# Patient Record
Sex: Male | Born: 1960 | Race: White | Hispanic: No | Marital: Married | Smoking: Never smoker
Health system: Southern US, Community
[De-identification: ages and names within clinical notes are randomized; demographics above are authoritative.]

---

## 2017-12-07 ENCOUNTER — Encounter: Payer: Self-pay | Admitting: Sports Medicine

## 2017-12-07 ENCOUNTER — Ambulatory Visit: Payer: 59 | Admitting: Sports Medicine

## 2017-12-07 ENCOUNTER — Ambulatory Visit (INDEPENDENT_AMBULATORY_CARE_PROVIDER_SITE_OTHER): Payer: 59

## 2017-12-07 VITALS — BP 137/81 | HR 67 | Ht 72.0 in | Wt 285.0 lb

## 2017-12-07 DIAGNOSIS — M216X2 Other acquired deformities of left foot: Secondary | ICD-10-CM

## 2017-12-07 DIAGNOSIS — M722 Plantar fascial fibromatosis: Secondary | ICD-10-CM | POA: Diagnosis not present

## 2017-12-07 DIAGNOSIS — M79672 Pain in left foot: Secondary | ICD-10-CM

## 2017-12-07 DIAGNOSIS — L84 Corns and callosities: Secondary | ICD-10-CM | POA: Diagnosis not present

## 2017-12-07 MED ORDER — METHYLPREDNISOLONE 4 MG PO TBPK: ORAL_TABLET | ORAL | 0 refills | Status: DC

## 2017-12-07 MED ORDER — MELOXICAM 15 MG PO TABS: 15.0000 mg | ORAL_TABLET | Freq: Every day | ORAL | 0 refills | Status: DC

## 2017-12-07 NOTE — Progress Notes (Signed)
Subjective: Samuel Villarreal is a 57 y.o. male patient presents to office with complaint of moderate heel pain some days on the left for the last 2 weeks. Patient admits that the pain could have started after a trip to [place] with a did a lot of walking and driving.  Patient admits to pain that feels like a burning sensation when he is attempting to stretch and sometimes sharp to the bottom and sometimes the back of the heel.  Patient states that he has tried using a golf ball and stretching with no additional relief.  Patient states that he is also concerned about callus areas to the bottom of his feet he has tried over-the-counter wart pads and wants to have this checked as well. Denies any other pedal complaints.   Review of Systems  Musculoskeletal: Positive for joint pain.  All other systems reviewed and are negative.   There are no active problems to display for this patient.   No current outpatient medications on file prior to visit.   No current facility-administered medications on file prior to visit.     No Known Allergies  Objective: Physical Exam General: The patient is alert and oriented x3 in no acute distress.  Dermatology: Mild callusing to bilateral heels and plantar lateral foot, skin is warm, dry and supple bilateral lower extremities. Nails 1-10 are normal. There is no erythema, edema, no eccymosis, no open lesions present. Integument is otherwise unremarkable.  Vascular: Dorsalis Pedis pulse and Posterior Tibial pulse are 2/4 bilateral. Capillary fill time is immediate to all digits.  Neurological: Grossly intact to light touch with an achilles reflex of +2/5 and a  negative Tinel's sign bilateral.  Musculoskeletal: Minimal tenderness to palpation at the medial calcaneal tubercale and through the insertion of the plantar fascia on the left foot. No pain with compression of calcaneus bilateral. No pain with tuning fork to calcaneus bilateral. No pain with calf  compression bilateral. There is decreased Ankle joint range of motion bilateral left greater than right. All other joints range of motion within normal limits bilateral. Strength 5/5 in all groups bilateral.   Gait: Unassisted  Xray, Left foot:  Normal osseous mineralization. Joint spaces preserved. No fracture/dislocation/boney destruction.  Minimal calcaneal spur present with mild thickening of plantar fascia. No other soft tissue abnormalities or radiopaque foreign bodies.   Assessment and Plan: Problem List Items Addressed This Visit    None    Visit Diagnoses    Plantar fasciitis, left    -  Primary   Relevant Medications   methylPREDNISolone (MEDROL DOSEPAK) 4 MG TBPK tablet   meloxicam (MOBIC) 15 MG tablet   Other Relevant Orders   DG Foot Complete Left   Pain of left heel       Relevant Orders   DG Foot Complete Left   Acquired equinus deformity of left foot       Callus of heel         -Complete examination performed.  -Recommend for callus skin over-the-counter to keep healthy feet and use of pumice stone as needed -Xrays reviewed -Discussed with patient in detail the condition of plantar fasciitis with tight Achilles, how this occurs and general treatment options. Explained both conservative and surgical treatments.  -Rx Meloxicam to start after Medrol dose pack is completed -Recommended good supportive shoes and advised use of OTC insert. Explained to patient that if these orthoses work well, we will continue with these. If these do not improve his condition and  pain, we will consider custom molded orthoses. - Explained in detail the use of the left night splint which was dispensed at today's visit. -Explained and dispensed to patient daily stretching exercises. -Recommend patient to ice affected area 1-2x daily. -Patient to return to office in 4 weeks for follow up or sooner if problems or questions arise. If pain no better injection next visit.  Asencion Islam,  DPM

## 2017-12-07 NOTE — Patient Instructions (Signed)

## 2018-01-04 ENCOUNTER — Ambulatory Visit: Payer: 59 | Admitting: Sports Medicine

## 2018-01-04 ENCOUNTER — Encounter: Payer: Self-pay | Admitting: Sports Medicine

## 2018-01-04 DIAGNOSIS — M79672 Pain in left foot: Secondary | ICD-10-CM

## 2018-01-04 DIAGNOSIS — M722 Plantar fascial fibromatosis: Secondary | ICD-10-CM

## 2018-01-04 DIAGNOSIS — M216X2 Other acquired deformities of left foot: Secondary | ICD-10-CM

## 2018-01-04 MED ORDER — TRIAMCINOLONE ACETONIDE 40 MG/ML IJ SUSP: 20.0000 mg | Freq: Once | INTRAMUSCULAR | Status: AC

## 2018-01-04 NOTE — Progress Notes (Signed)
Subjective: Samuel Villarreal is a 57 y.o. male patient returns to office for follow-up of left heel pain.  Patient states that he has taken his meloxicam and steroid Dosepak and it has not helped relieve the pain completely states that the pain was eased off however he still having symptoms especially with first getting up after period of sitting and with first few steps out of bed in the morning states that the pain is sharp 5 out of 10 to the heel states that he also has been compliant with using his inserts and night splint and doing stretching which has not helped.  Patient states that he has even cut back on his exercise to see if this would also help with giving him some relief. Denies any other pedal complaints.   There are no active problems to display for this patient.   Current Outpatient Medications on File Prior to Visit  Medication Sig Dispense Refill  . meloxicam (MOBIC) 15 MG tablet Take 1 tablet (15 mg total) by mouth daily. 30 tablet 0  . methylPREDNISolone (MEDROL DOSEPAK) 4 MG TBPK tablet Take 1st as instructed 21 tablet 0   No current facility-administered medications on file prior to visit.     No Known Allergies  Objective: Physical Exam General: The patient is alert and oriented x3 in no acute distress.  Dermatology: Mild callusing to bilateral heels and plantar lateral foot, skin is warm, dry and supple bilateral lower extremities. Nails 1-10 are normal. There is no erythema, edema, no eccymosis, no open lesions present. Integument is otherwise unremarkable.  Vascular: Dorsalis Pedis pulse and Posterior Tibial pulse are 2/4 bilateral. Capillary fill time is immediate to all digits.  Neurological: Grossly intact to light touch with an achilles reflex of +2/5 and a  negative Tinel's sign bilateral.  Musculoskeletal: Mild tenderness to palpation at the medial calcaneal tubercale and through the insertion of the plantar fascia on the left foot. No pain with compression  of calcaneus bilateral. No pain with tuning fork to calcaneus bilateral. No pain with calf compression bilateral. There is decreased Ankle joint range of motion bilateral left greater than right. All other joints range of motion within normal limits bilateral. Strength 5/5 in all groups bilateral.   Assessment and Plan: Problem List Items Addressed This Visit    None    Visit Diagnoses    Plantar fasciitis, left    -  Primary   Relevant Medications   triamcinolone acetonide (KENALOG-40) injection 20 mg (Start on 01/04/2018  5:00 PM)   Pain of left heel       Acquired equinus deformity of left foot         -Complete examination performed.  -Re-Discussed with patient in detail the condition of plantar fasciitis with tight Achilles, how this occurs and general treatment options. Explained both conservative and surgical treatments.  After oral consent and aseptic prep, injected a mixture containing 1 ml of 2% plain lidocaine, 1 ml 0.5% plain marcaine, 0.5 ml of kenalog 40 and 0.5 ml of dexamethasone phosphate into left heel without complication. Post-injection care discussed with patient.  -Dispensed heel lift to use bilateral -Recommend continue with good supportive shoes and advised use of OTC insert with the heel lift. -Continue with night splint daily -Continue with daily stretching exercises -Continue to ice affected area 1-2x daily. -Patient to return to office in 4 weeks for follow up or sooner if problems or questions arise.   Samuel Villarreal, DPM

## 2018-02-02 ENCOUNTER — Ambulatory Visit: Payer: 59 | Admitting: Sports Medicine

## 2018-05-15 ENCOUNTER — Ambulatory Visit: Payer: 59 | Admitting: Podiatry

## 2018-05-15 DIAGNOSIS — M25572 Pain in left ankle and joints of left foot: Secondary | ICD-10-CM | POA: Diagnosis not present

## 2018-05-15 DIAGNOSIS — M722 Plantar fascial fibromatosis: Secondary | ICD-10-CM | POA: Diagnosis not present

## 2018-05-15 DIAGNOSIS — M25372 Other instability, left ankle: Secondary | ICD-10-CM

## 2018-05-15 DIAGNOSIS — R269 Unspecified abnormalities of gait and mobility: Secondary | ICD-10-CM | POA: Diagnosis not present

## 2018-05-15 DIAGNOSIS — G8929 Other chronic pain: Secondary | ICD-10-CM | POA: Diagnosis not present

## 2018-05-15 MED ORDER — MELOXICAM 15 MG PO TABS: 15.0000 mg | ORAL_TABLET | Freq: Every day | ORAL | 0 refills | Status: DC

## 2018-06-02 NOTE — Progress Notes (Signed)
  Subjective:  Patient ID: Samuel Villarreal, male    DOB: 1960/12/30,  MRN: [id number]  No chief complaint on file.   [age] y.o. male presents with the above complaint.  Reports that his ankle is hurting and that leads into the heel and arch.  Present for the past 2 weeks.  Reports is very stiff in the morning.  Using ibuprofen and Tylenol.  States that he had surgery in the shoulder 726 ever since his foot felt tight.   Review of Systems: Negative except as noted in the HPI. Denies N/V/F/Ch.  No past medical history on file.  Current Outpatient Medications:  .  meloxicam (MOBIC) 15 MG tablet, Take 1 tablet (15 mg total) by mouth daily., Disp: 30 tablet, Rfl: 0 .  methylPREDNISolone (MEDROL DOSEPAK) 4 MG TBPK tablet, Take 1st as instructed, Disp: 21 tablet, Rfl: 0  Current Facility-Administered Medications:  .  triamcinolone acetonide (KENALOG-40) injection 20 mg, 20 mg, Other, Once, [place], [place], DPM  Social History   Tobacco Use  Smoking Status Never Smoker  Smokeless Tobacco Never Used    No Known Allergies Objective:  There were no vitals filed for this visit. There is no height or weight on file to calculate BMI. Constitutional Well developed. Well nourished.  Vascular Dorsalis pedis pulses palpable bilaterally. Posterior tibial pulses palpable bilaterally. Capillary refill normal to all digits.  No cyanosis or clubbing noted. Pedal hair growth normal.  Neurologic Normal speech. Oriented to person, place, and time. Epicritic sensation to light touch grossly present bilaterally.  Dermatologic Nails well groomed and normal in appearance. No open wounds. No skin lesions.  Orthopedic: Normal joint ROM without pain or crepitus bilaterally. No visible deformities. Pain palpation about the left ATFL   Radiographs: None Assessment:   1. Instability of left ankle joint   2. Plantar fasciitis, left   3. Chronic pain of left ankle   4. Gait disturbance    Plan:    Patient was evaluated and treated and all questions answered.  Ankle instability -Dispensed Tri-Lock brace to support and protect the ankle -Rx meloxicam -Would consider PT  Return in about 6 weeks (around 06/26/2018) for Tendonitis.

## 2018-06-26 ENCOUNTER — Ambulatory Visit: Payer: 59 | Admitting: Podiatry

## 2018-06-26 DIAGNOSIS — M722 Plantar fascial fibromatosis: Secondary | ICD-10-CM

## 2018-06-26 DIAGNOSIS — M25372 Other instability, left ankle: Secondary | ICD-10-CM | POA: Diagnosis not present

## 2018-06-26 DIAGNOSIS — M2141 Flat foot [pes planus] (acquired), right foot: Secondary | ICD-10-CM | POA: Diagnosis not present

## 2018-06-26 DIAGNOSIS — G8929 Other chronic pain: Secondary | ICD-10-CM | POA: Diagnosis not present

## 2018-06-26 DIAGNOSIS — M25572 Pain in left ankle and joints of left foot: Secondary | ICD-10-CM | POA: Diagnosis not present

## 2018-06-26 DIAGNOSIS — M2142 Flat foot [pes planus] (acquired), left foot: Secondary | ICD-10-CM

## 2018-06-26 NOTE — Progress Notes (Signed)
  Subjective:  Patient ID: Samuel Villarreal, male    DOB: October 04, 1960,  MRN: [id number]  Chief Complaint  Patient presents with  . Tendonitis    F/U L Tendonitis Pt. stated," it flares up once in a while." Tx: ankle brace, and meloxicam as needed    57 y.o. male presents with the above complaint.  States the ankle flares up every once in a while.  Having pain in the heel on the left side today.  States that the ankle area on the back is feeling a little better.  Still having some pain on the inside of the heel.  Review of Systems: Negative except as noted in the HPI. Denies N/V/F/Ch.  No past medical history on file.  Current Outpatient Medications:  .  meloxicam (MOBIC) 15 MG tablet, Take 1 tablet (15 mg total) by mouth daily., Disp: 30 tablet, Rfl: 0 .  methylPREDNISolone (MEDROL DOSEPAK) 4 MG TBPK tablet, Take 1st as instructed, Disp: 21 tablet, Rfl: 0  Current Facility-Administered Medications:  .  triamcinolone acetonide (KENALOG-40) injection 20 mg, 20 mg, Other, Once, [place], [place], DPM  Social History   Tobacco Use  Smoking Status Never Smoker  Smokeless Tobacco Never Used    No Known Allergies Objective:  There were no vitals filed for this visit. There is no height or weight on file to calculate BMI. Constitutional Well developed. Well nourished.  Vascular Dorsalis pedis pulses palpable bilaterally. Posterior tibial pulses palpable bilaterally. Capillary refill normal to all digits.  No cyanosis or clubbing noted. Pedal hair growth normal.  Neurologic Normal speech. Oriented to person, place, and time. Epicritic sensation to light touch grossly present bilaterally.  Dermatologic Nails well groomed and normal in appearance. No open wounds. No skin lesions.  Orthopedic: Normal joint ROM without pain or crepitus bilaterally. POP L medial calc tuber POP L ATFL Pes planus bilat   Radiographs: None today Assessment:   1. Plantar fasciitis, left   2.  Instability of left ankle joint   3. Chronic pain of left ankle   4. Pes planus of both feet    Plan:  Patient was evaluated and treated and all questions answered.  Plantar Fasciitis, left - Educated on icing and stretching. Instructions given.  - Injection delivered to the plantar fascia as below.  Procedure: Injection Tendon/Ligament Location: Left plantar fascia at the glabrous junction; medial approach. Skin Prep: Alcohol. Injectate: 1 cc 0.5% marcaine plain, 1 cc dexamethasone phosphate, 0.5 cc kenalog 10. Disposition: Patient tolerated procedure well. Injection site dressed with a band-aid.  Pes Planus with ankle instability -Ankle symptoms improving. Continue ASO brace PRN -Would consider CMOs should pain persist.  Return in about 3 weeks (around 07/17/2018) for Plantar fasciitis, Left.

## 2018-06-26 NOTE — Patient Instructions (Signed)

## 2018-07-17 ENCOUNTER — Ambulatory Visit: Payer: 59 | Admitting: Podiatry

## 2018-08-08 ENCOUNTER — Encounter: Payer: Self-pay | Admitting: Gastroenterology

## 2021-12-07 ENCOUNTER — Ambulatory Visit: Payer: 59 | Admitting: Cardiology

## 2022-02-07 ENCOUNTER — Other Ambulatory Visit: Payer: Self-pay | Admitting: Internal Medicine

## 2022-02-07 DIAGNOSIS — E042 Nontoxic multinodular goiter: Secondary | ICD-10-CM

## 2022-02-11 ENCOUNTER — Ambulatory Visit
Admission: RE | Admit: 2022-02-11 | Discharge: 2022-02-11 | Disposition: A | Discharge status: Home / Self Care | Payer: 59 | Source: Ambulatory Visit | Attending: Internal Medicine | Admitting: Internal Medicine

## 2022-02-11 DIAGNOSIS — E042 Nontoxic multinodular goiter: Secondary | ICD-10-CM

## 2023-01-11 ENCOUNTER — Other Ambulatory Visit: Payer: Self-pay | Admitting: Nurse Practitioner

## 2023-01-11 DIAGNOSIS — E042 Nontoxic multinodular goiter: Secondary | ICD-10-CM

## 2023-02-06 ENCOUNTER — Ambulatory Visit
Admission: RE | Admit: 2023-02-06 | Discharge: 2023-02-06 | Disposition: A | Discharge status: Home / Self Care | Payer: 59 | Source: Ambulatory Visit | Attending: Nurse Practitioner | Admitting: Nurse Practitioner

## 2023-02-06 DIAGNOSIS — E042 Nontoxic multinodular goiter: Secondary | ICD-10-CM

## 2023-12-08 IMAGING — US US THYROID
1 series · 13 of 25 positions shown · non-contrast
Comparison: None Available.

CLINICAL DATA: 60-year-old male with a history of multinodular
thyroid

EXAM:
THYROID ULTRASOUND
TECHNIQUE: Ultrasound examination of the thyroid gland and adjacent soft
tissues was performed.

[Series 1: us thyroid · 0.08mm/px · 13 of 40 slices shown]
[im 1/40]
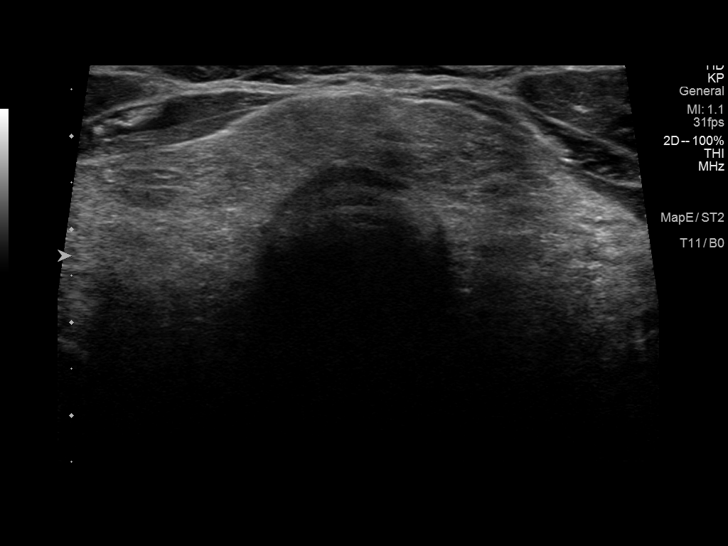
[im 4/40]
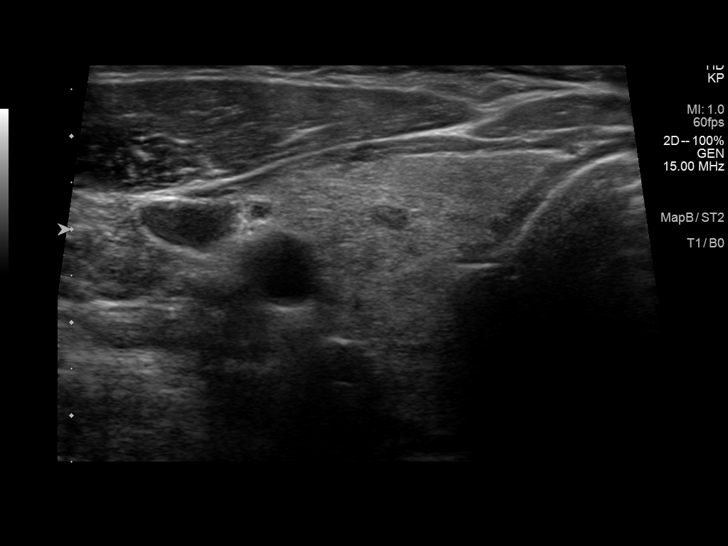
[im 7/40]
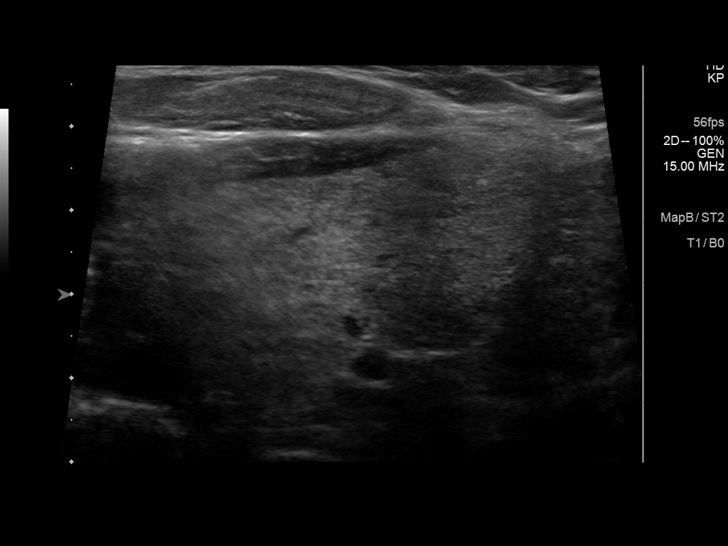
[im 10/40]
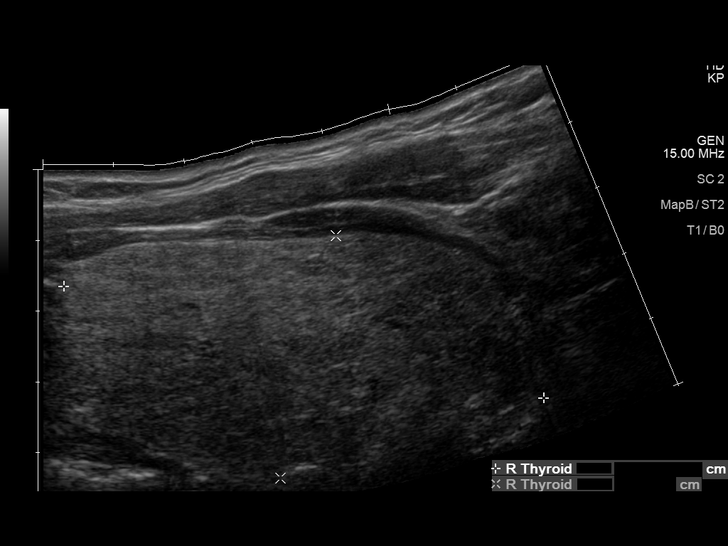
[im 14/40]
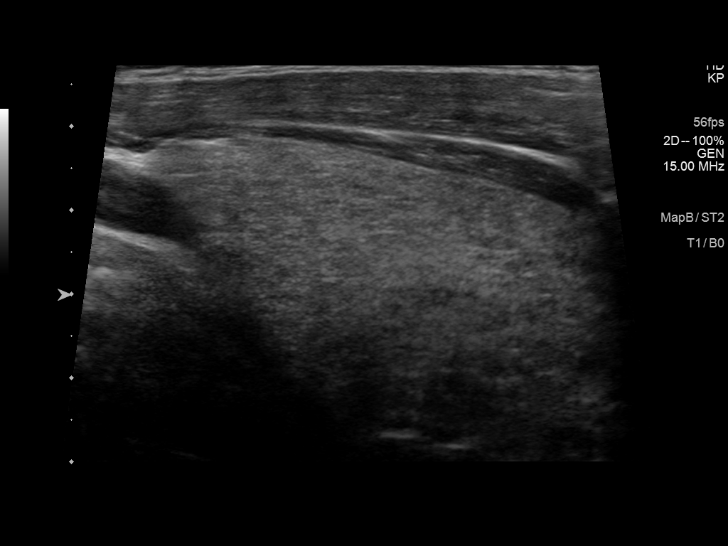
[im 17/40]
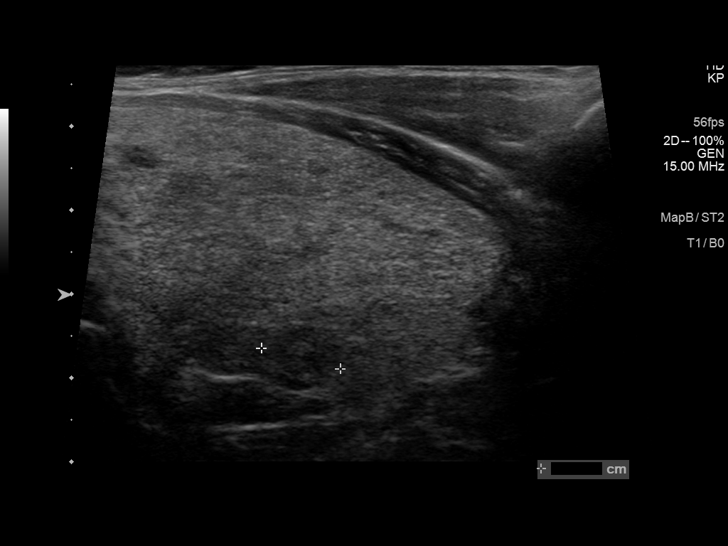
[im 20/40]
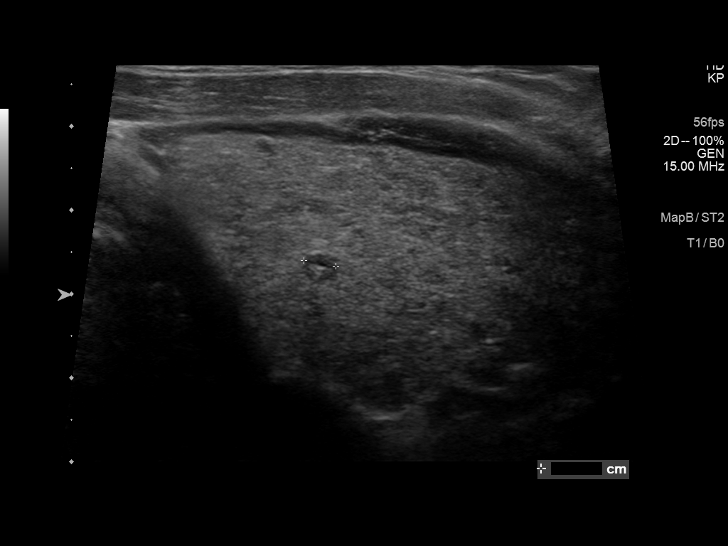
[im 23/40]
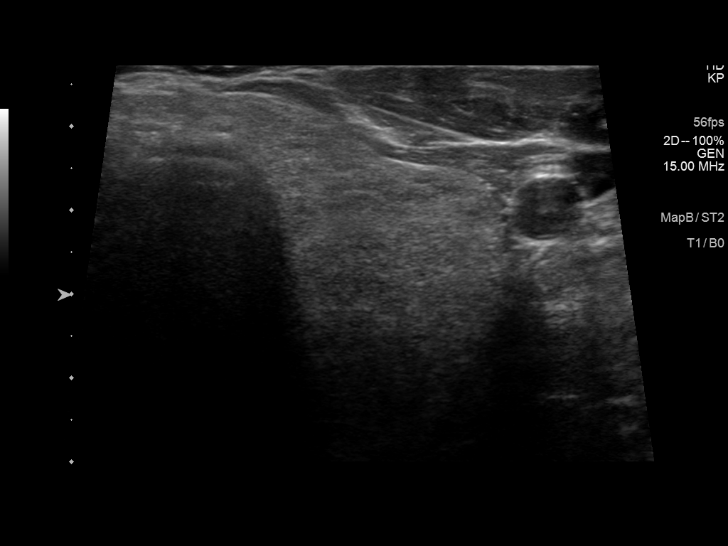
[im 27/40]
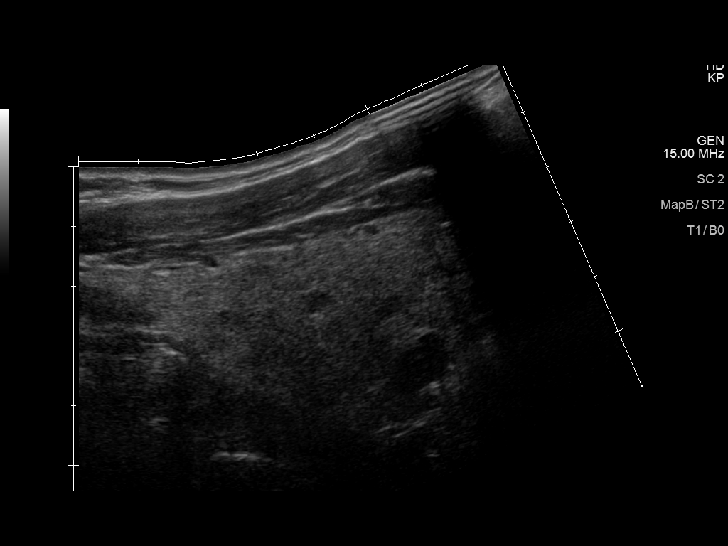
[im 30/40]
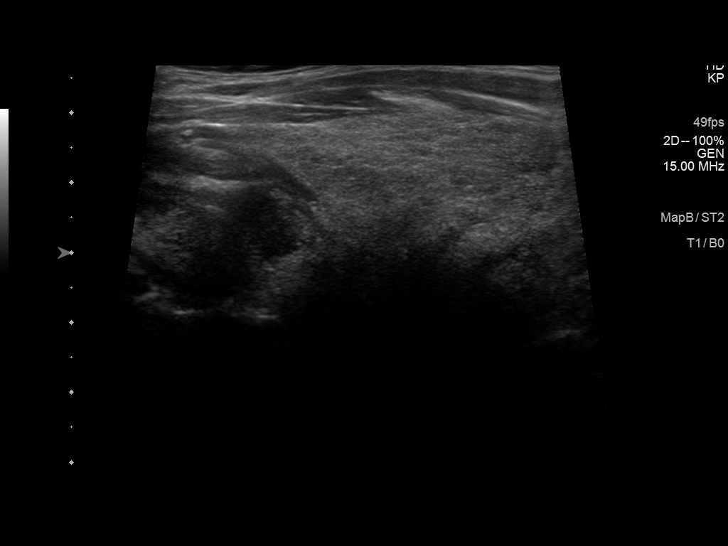
[im 33/40]
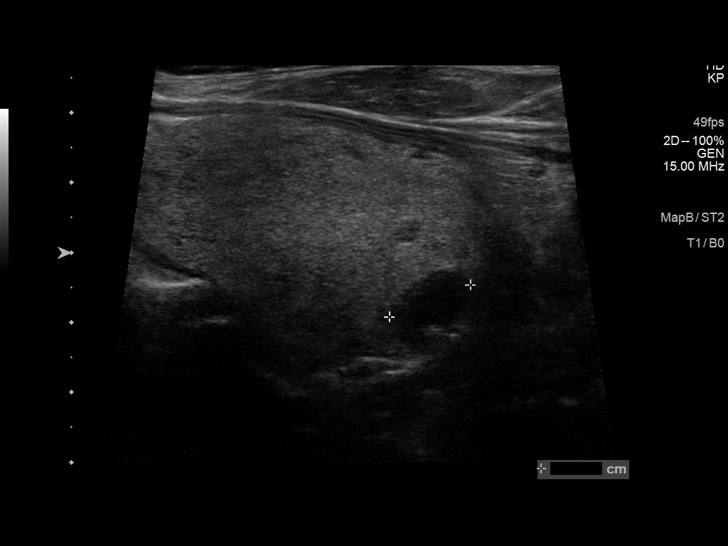
[im 36/40]
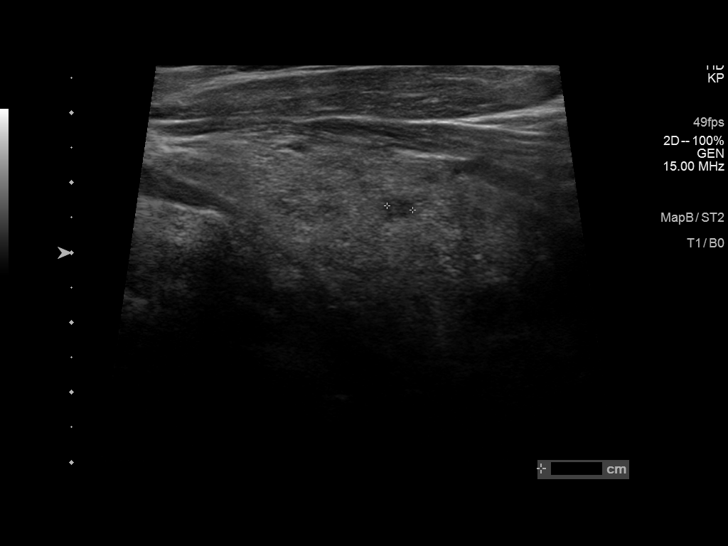
[im 40/40]
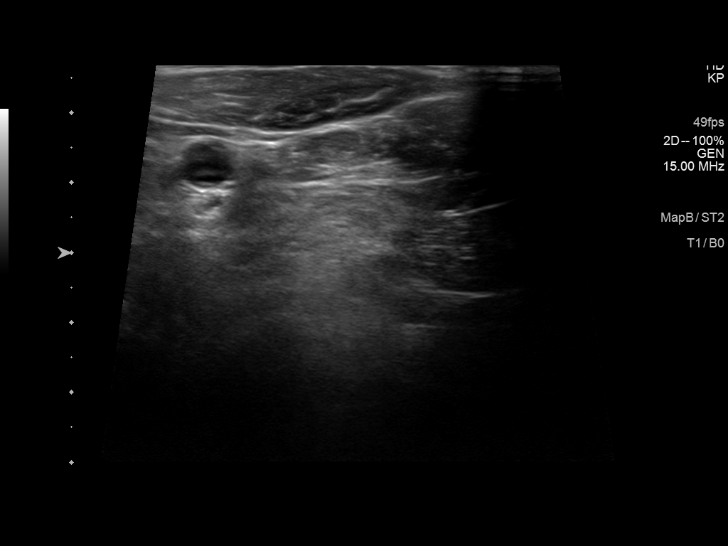

[13 of 25 positions shown; findings below may reference images not displayed]

FINDINGS: Parenchymal Echotexture: Mildly heterogenous

Isthmus: 0.8 cm

Right lobe: 6.9 cm x 3.5 cm x 3.9 cm

Left lobe: 7.2 cm x 3.1 cm x 2.6 cm

_________________________________________________________

Estimated total number of nodules >/= 1 cm: 2

Number of spongiform nodules >/=  2 cm not described below (TR1): 0

Number of mixed cystic and solid nodules >/= 1.5 cm not described
below (TR2): 0

_________________________________________________________

Nodule # 1:

Location: Right; Mid

Maximum size: 1.0 cm; Other 2 dimensions: 1.0 cm x 0.9 cm

Composition: cannot determine (2)

Echogenicity: hypoechoic (2)

Shape: not taller-than-wide (0)

Margins: ill-defined (0)

Echogenic foci: none (0)

ACR TI-RADS total points: 4.

ACR TI-RADS risk category: TR4 (4-6 points).

ACR TI-RADS recommendations:

Nodule meets criteria for surveillance

_________________________________________________________

Nodule # 2:

Location: Left; Inferior

Maximum size: 1.3 cm; Other 2 dimensions: 1.2 cm x 1.0 cm

Composition: cystic/almost completely cystic (0)

Echogenicity: anechoic (0)

Shape: not taller-than-wide (0)

Margins: smooth (0)

Echogenic foci: none (0)

ACR TI-RADS total points: 0.

ACR TI-RADS risk category: TR1 (0-1 points).

ACR TI-RADS recommendations:

Cystic nodule does not meet criteria for surveillance or biopsy

_________________________________________________________

No adenopathy
IMPRESSION: Heterogeneous enlarged thyroid, suggesting medical thyroid disease.

Right inferior thyroid nodule (labeled 1, 1.0 cm, TR 4) meets
criteria for surveillance, as designated by the newly established
ACR TI-RADS criteria. Surveillance ultrasound study recommended to
be performed annually up to 5 years.

Recommendations follow those established by the new ACR TI-RADS
criteria ([HOSPITAL] 2409;[DATE]).

## 2023-12-18 ENCOUNTER — Ambulatory Visit: Admitting: Podiatry

## 2023-12-18 ENCOUNTER — Ambulatory Visit (INDEPENDENT_AMBULATORY_CARE_PROVIDER_SITE_OTHER)

## 2023-12-18 ENCOUNTER — Encounter: Payer: Self-pay | Admitting: Podiatry

## 2023-12-18 DIAGNOSIS — M2141 Flat foot [pes planus] (acquired), right foot: Secondary | ICD-10-CM

## 2023-12-18 DIAGNOSIS — M722 Plantar fascial fibromatosis: Secondary | ICD-10-CM

## 2023-12-18 DIAGNOSIS — M76822 Posterior tibial tendinitis, left leg: Secondary | ICD-10-CM

## 2023-12-18 DIAGNOSIS — M2142 Flat foot [pes planus] (acquired), left foot: Secondary | ICD-10-CM

## 2023-12-18 DIAGNOSIS — M76821 Posterior tibial tendinitis, right leg: Secondary | ICD-10-CM | POA: Diagnosis not present

## 2023-12-18 MED ORDER — MELOXICAM 15 MG PO TABS
15.0000 mg | ORAL_TABLET | Freq: Every day | ORAL | 0 refills | Status: AC
Start: 2023-12-18 — End: 2024-01-08

## 2023-12-18 MED ORDER — TRIAMCINOLONE ACETONIDE 10 MG/ML IJ SUSP
5.0000 mg | Freq: Once | INTRAMUSCULAR | Status: DC
Start: 2023-12-18 — End: 2023-12-18

## 2023-12-18 NOTE — Progress Notes (Unsigned)
 Chief Complaint  Patient presents with   Plantar Fasciitis    Right foot plantar fasciitis. He has a brace from the left side that was treated in 2019. Arch feels like it is on fire. Pins and needles type pain. Flared up in the fall and has progressivly gotten worse. Not diabetic and no anti coag.     HPI: 63 y.o. male presents today right foot arch pain.  This episode has been bothering him for several months now.  He saw Dr. Samuella Cota back in 2019 for plan fasciitis.  States that the pain is similar however he locates that this is more in the arch inside of his foot rather than in the heel.  He has been using a brace that he got at that time to help.  History reviewed. No pertinent past medical history.  History reviewed. No pertinent surgical history.  No Known Allergies  ROS denies any nausea, vomiting, fever, chills, chest pain, shortness of breath.  Allie she is being good for   Physical Exam: There were no vitals filed for this visit.  General: The patient is alert and oriented x3 in no acute distress.  Dermatology: Skin is warm, dry and supple bilateral lower extremities. Interspaces are clear of maceration and debris.    Vascular: Palpable pedal pulses bilaterally. Capillary refill within normal limits.  No appreciable edema.  No erythema or calor.  Telangiectasias noted.  Neurological: Light touch sensation grossly intact bilateral feet.   Musculoskeletal Exam: Bilateral flexible pes planus noted.  Pedal joints feel lax, no restrictions to pedal range of motion.  Ankle joint is nearly hypermobile in dorsiflexion.  No pain on palpation of right heel at plantar medial aspect or centrally.  No gaps in plantar fascia.  Positive windlass mechanism.  Most of the pain is along the plantar medial arch at PT insertion.  Some what tender at medial band plantar fascia as well.  Radiographic Exam: Right foot 12/18/2023 Normal osseous mineralization. Joint spaces preserved.  No  fractures or osseous irregularities noted.  Pes planus foot type noted  Assessment/Plan of Care: 1. Posterior tibial tendon dysfunction (PTTD) of both lower extremities   2. Pes planus of both feet   3. Plantar fasciitis, left      Meds ordered this encounter  Medications   DISCONTD: triamcinolone acetonide (KENALOG) 10 MG/ML injection 5 mg   meloxicam (MOBIC) 15 MG tablet    Sig: Take 1 tablet (15 mg total) by mouth daily for 21 days.    Dispense:  21 tablet    Refill:  0   None  Discussed clinical findings with patient today.  Discussed findings of flexible pes planus and PT tendinitis.  Power steps dispensed to patient today  Rehabilitation exercises discussed with patient  Starting patient on course of oral meloxicam.  Deferring injection at this point, could consider injection along medial band plantar fascia if symptomatic at next visit  Follow-up in 3 to 4 weeks.  May continue to use the brace.  If symptomatic, obtain ankle x-rays as well along with improved AP view foot.  Samuel Villarreal, AACFAS [facility]     [address], [place] [address]  Office [phone]  Fax [phone]

## 2023-12-27 ENCOUNTER — Encounter: Payer: Self-pay | Admitting: Gastroenterology

## 2024-01-15 ENCOUNTER — Ambulatory Visit (INDEPENDENT_AMBULATORY_CARE_PROVIDER_SITE_OTHER): Admitting: Podiatry

## 2024-01-15 ENCOUNTER — Encounter: Payer: Self-pay | Admitting: Podiatry

## 2024-01-15 DIAGNOSIS — M76821 Posterior tibial tendinitis, right leg: Secondary | ICD-10-CM

## 2024-01-15 DIAGNOSIS — M2141 Flat foot [pes planus] (acquired), right foot: Secondary | ICD-10-CM

## 2024-01-15 DIAGNOSIS — M76822 Posterior tibial tendinitis, left leg: Secondary | ICD-10-CM

## 2024-01-15 DIAGNOSIS — M2142 Flat foot [pes planus] (acquired), left foot: Secondary | ICD-10-CM | POA: Diagnosis not present

## 2024-01-15 NOTE — Progress Notes (Signed)
       Chief Complaint  Patient presents with   PTTD    Pretty much once he put the orthotic in is pain went away, his feet do get tired after being up all day but the pain is not as bad. Not diabetic and no anti coag.     HPI: 63 y.o. male presents today following up for bilateral PTTD and pes planus.  He reports doing very well since using the power steps.  Denies any pain today.  Does report that he has had some intermittent momentary flareups but he is doing quite well.  Denying any symptoms to plantar fascia pain from previous.  History reviewed. No pertinent past medical history.  History reviewed. No pertinent surgical history.  No Known Allergies  ROS    Physical Exam: There were no vitals filed for this visit.  General: The patient is alert and oriented x3 in no acute distress.  Dermatology: Skin is warm, dry and supple bilateral lower extremities. Interspaces are clear of maceration and debris.    Vascular: Palpable pedal pulses bilaterally. Capillary refill within normal limits.  No appreciable edema.  No erythema or calor.  Neurological: Light touch sensation grossly intact bilateral feet.   Musculoskeletal Exam: Bilateral flexible pes planus noted.  Excessive ankle joint dorsiflexion noted.  No pain on palpation of PT tendon or to navicular tuberosity today.   Assessment/Plan of Care: 1. Posterior tibial tendon dysfunction (PTTD) of both lower extremities   2. Pes planus of both feet      No orders of the defined types were placed in this encounter.  None  Discussed clinical findings with patient today.  Patient is doing great at this point.  He reports significant improvement.  Continue use of the power steps.  Discussed importance of continued use of good supportive shoe gear.  Did offer custom orthotics.  He would like to defer this for now.  He can follow-up as needed if he would like to proceed with this.   Kaedynce Tapp L. Lunda Salines, AACFAS [facility]     [address] [place], [place] [address]                Office [phone]  Fax [phone]
